# Patient Record
Sex: Female | Born: 1980 | Hispanic: Yes | Marital: Single | Smoking: Current every day smoker
Health system: Southern US, Community
[De-identification: ages and names within clinical notes are randomized; demographics above are authoritative.]

## PROBLEM LIST (undated history)

## (undated) HISTORY — PX: CHOLECYSTECTOMY: SHX55

## (undated) HISTORY — PX: TUBAL LIGATION: SHX77

---
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
## 2002-04-23 ENCOUNTER — Emergency Department (HOSPITAL_COMMUNITY): Admission: EM | Admit: 2002-04-23 | Discharge: 2002-04-24 | Payer: Self-pay | Admitting: Emergency Medicine

## 2009-10-30 ENCOUNTER — Emergency Department (HOSPITAL_COMMUNITY)
Admission: EM | Admit: 2009-10-30 | Discharge: 2009-10-30 | Payer: Self-pay | Source: Home / Self Care | Admitting: Emergency Medicine

## 2009-12-24 ENCOUNTER — Ambulatory Visit (HOSPITAL_COMMUNITY): Admission: RE | Admit: 2009-12-24 | Discharge: 2009-12-24 | Payer: Self-pay | Admitting: Family Medicine

## 2010-01-14 ENCOUNTER — Ambulatory Visit (HOSPITAL_COMMUNITY): Admission: RE | Admit: 2010-01-14 | Discharge: 2010-01-14 | Payer: Self-pay | Admitting: Family Medicine

## 2010-03-06 ENCOUNTER — Inpatient Hospital Stay (HOSPITAL_COMMUNITY)
Admission: AD | Admit: 2010-03-06 | Discharge: 2010-03-06 | Payer: Self-pay | Source: Home / Self Care | Admitting: Obstetrics & Gynecology

## 2010-03-07 ENCOUNTER — Ambulatory Visit: Payer: Self-pay | Admitting: Obstetrics and Gynecology

## 2010-03-07 ENCOUNTER — Ambulatory Visit: Payer: Self-pay | Admitting: Obstetrics & Gynecology

## 2010-03-07 ENCOUNTER — Inpatient Hospital Stay (HOSPITAL_COMMUNITY): Admission: AD | Admit: 2010-03-07 | Discharge: 2010-03-09 | Payer: Self-pay | Admitting: Obstetrics & Gynecology

## 2010-05-07 ENCOUNTER — Encounter: Payer: Self-pay | Admitting: Family Medicine

## 2010-06-28 LAB — CBC
HCT: 37.1 % (ref 36.0–46.0)
Hemoglobin: 12.2 g/dL (ref 12.0–15.0)
MCH: 26.3 pg (ref 26.0–34.0)
MCHC: 33 g/dL (ref 30.0–36.0)
MCV: 79.5 fL (ref 78.0–100.0)
Platelets: 164 10*3/uL (ref 150–400)
RBC: 4.66 MIL/uL (ref 3.87–5.11)
RDW: 13.3 % (ref 11.5–15.5)
WBC: 8.8 10*3/uL (ref 4.0–10.5)

## 2010-06-28 LAB — RPR: RPR Ser Ql: NONREACTIVE

## 2010-07-02 LAB — URINALYSIS, ROUTINE W REFLEX MICROSCOPIC
Protein, ur: NEGATIVE mg/dL
Urobilinogen, UA: 0.2 mg/dL (ref 0.0–1.0)

## 2010-07-02 LAB — DIFFERENTIAL
Basophils Absolute: 0 10*3/uL (ref 0.0–0.1)
Basophils Relative: 0 % (ref 0–1)
Monocytes Absolute: 0.7 10*3/uL (ref 0.1–1.0)
Neutro Abs: 5.7 10*3/uL (ref 1.7–7.7)
Neutrophils Relative %: 71 % (ref 43–77)

## 2010-07-02 LAB — URINE MICROSCOPIC-ADD ON

## 2010-07-02 LAB — URINE CULTURE
Colony Count: NO GROWTH
Culture: NO GROWTH

## 2010-07-02 LAB — CBC
HCT: 35.4 % — ABNORMAL LOW (ref 36.0–46.0)
MCHC: 34.5 g/dL (ref 30.0–36.0)
RDW: 12.7 % (ref 11.5–15.5)

## 2010-07-21 ENCOUNTER — Inpatient Hospital Stay (INDEPENDENT_AMBULATORY_CARE_PROVIDER_SITE_OTHER)
Admission: RE | Admit: 2010-07-21 | Discharge: 2010-07-21 | Disposition: A | Discharge status: Home / Self Care | Payer: Medicaid Other | Source: Ambulatory Visit | Attending: Family Medicine | Admitting: Family Medicine

## 2010-07-21 DIAGNOSIS — J069 Acute upper respiratory infection, unspecified: Secondary | ICD-10-CM

## 2010-12-31 ENCOUNTER — Emergency Department (HOSPITAL_COMMUNITY): Payer: Medicaid Other

## 2010-12-31 ENCOUNTER — Emergency Department (HOSPITAL_COMMUNITY)
Admission: EM | Admit: 2010-12-31 | Discharge: 2010-12-31 | Disposition: A | Discharge status: Home / Self Care | Payer: Medicaid Other | Attending: Internal Medicine | Admitting: Internal Medicine

## 2010-12-31 DIAGNOSIS — Z9089 Acquired absence of other organs: Secondary | ICD-10-CM | POA: Insufficient documentation

## 2010-12-31 DIAGNOSIS — R109 Unspecified abdominal pain: Secondary | ICD-10-CM | POA: Insufficient documentation

## 2010-12-31 DIAGNOSIS — R079 Chest pain, unspecified: Secondary | ICD-10-CM | POA: Insufficient documentation

## 2010-12-31 LAB — URINALYSIS, ROUTINE W REFLEX MICROSCOPIC
Bilirubin Urine: NEGATIVE
Hgb urine dipstick: NEGATIVE
Ketones, ur: NEGATIVE mg/dL
Nitrite: NEGATIVE
Urobilinogen, UA: 0.2 mg/dL (ref 0.0–1.0)

## 2010-12-31 LAB — DIFFERENTIAL
Eosinophils Absolute: 0.1 10*3/uL (ref 0.0–0.7)
Eosinophils Relative: 2 % (ref 0–5)
Lymphs Abs: 2.4 10*3/uL (ref 0.7–4.0)
Monocytes Absolute: 0.6 10*3/uL (ref 0.1–1.0)
Monocytes Relative: 8 % (ref 3–12)

## 2010-12-31 LAB — COMPREHENSIVE METABOLIC PANEL
Alkaline Phosphatase: 77 U/L (ref 39–117)
BUN: 10 mg/dL (ref 6–23)
CO2: 27 mEq/L (ref 19–32)
Calcium: 9.4 mg/dL (ref 8.4–10.5)
GFR calc Af Amer: >60 mL/min (ref >60)
GFR calc non Af Amer: >60 mL/min (ref >60)
Glucose, Bld: 85 mg/dL (ref 70–99)
Potassium: 4.3 mEq/L (ref 3.5–5.1)
Total Protein: 7.3 g/dL (ref 6.0–8.3)

## 2010-12-31 LAB — CBC
MCH: 27.7 pg (ref 26.0–34.0)
MCHC: 35.3 g/dL (ref 30.0–36.0)
MCV: 78.4 fL (ref 78.0–100.0)
Platelets: 219 10*3/uL (ref 150–400)
RDW: 12.5 % (ref 11.5–15.5)

## 2010-12-31 LAB — POCT PREGNANCY, URINE: Preg Test, Ur: NEGATIVE

## 2010-12-31 LAB — LIPASE, BLOOD: Lipase: 38 U/L (ref 11–59)

## 2012-07-07 ENCOUNTER — Encounter (HOSPITAL_COMMUNITY): Payer: Self-pay | Admitting: Family Medicine

## 2012-07-07 ENCOUNTER — Emergency Department (HOSPITAL_COMMUNITY)
Admission: EM | Admit: 2012-07-07 | Discharge: 2012-07-07 | Disposition: A | Discharge status: Home / Self Care | Payer: Self-pay | Attending: Emergency Medicine | Admitting: Emergency Medicine

## 2012-07-07 DIAGNOSIS — H18839 Recurrent erosion of cornea, unspecified eye: Secondary | ICD-10-CM | POA: Insufficient documentation

## 2012-07-07 DIAGNOSIS — F17200 Nicotine dependence, unspecified, uncomplicated: Secondary | ICD-10-CM | POA: Insufficient documentation

## 2012-07-07 DIAGNOSIS — H16002 Unspecified corneal ulcer, left eye: Secondary | ICD-10-CM

## 2012-07-07 DIAGNOSIS — R209 Unspecified disturbances of skin sensation: Secondary | ICD-10-CM | POA: Insufficient documentation

## 2012-07-07 DIAGNOSIS — H5460 Unqualified visual loss, one eye, unspecified: Secondary | ICD-10-CM | POA: Insufficient documentation

## 2012-07-07 MED ORDER — TEARS RENEWED OP SOLN: 1.0000 [drp] | Freq: Three times a day (TID) | OPHTHALMIC | Status: DC | PRN

## 2012-07-07 MED ORDER — BACITRACIN-POLYMYXIN B 500-10000 UNIT/GM OP OINT: TOPICAL_OINTMENT | Freq: Two times a day (BID) | OPHTHALMIC | Status: DC

## 2012-07-07 MED ORDER — TETRACAINE HCL 0.5 % OP SOLN
2.0000 [drp] | Freq: Once | OPHTHALMIC | Status: DC
  Filled 2012-07-07: qty 2

## 2012-07-07 NOTE — ED Notes (Signed)
Patient states that she was watching TV four hours ago and her left eye started hurting. States her left eye started running 3 hours ago. Denies injury or trauma. States there is a "scratching" sensation and feels like something is in her eye. Reports that her vision is blurry.

## 2012-07-07 NOTE — ED Provider Notes (Signed)
History    This chart was scribed for non-physician practitioner working with Richardean Canal, MD by Donne Anon, ED Scribe. This patient was seen in room WTR7/WTR7 and the patient's care was started at 2024.   CSN: [id number]  Arrival date & time 07/07/12  1929   First MD Initiated Contact with Patient 07/07/12 2024      Chief Complaint  Patient presents with  . Eye Pain     The history is provided by the patient. No language interpreter was used.   Sierra Carson is a 32 y.o. female who presents to the Emergency Department complaining of sudden onset, constant, gradually worsening moderate left eye pain which began 4 hours PTA while she was watching TV. She reports associated watering of her left eye. She denies any injury or trauma. She states there is a scratching sensation and that her vision is slightly decreased in her left eye. She does not have pets in her home.   History reviewed. No pertinent past medical history.  Past Surgical History  Procedure Laterality Date  . Cholecystectomy      No family history on file.  History  Substance Use Topics  . Smoking status: Current Every Day Smoker -- 0.25 packs/day    Types: Cigarettes  . Smokeless tobacco: Not on file  . Alcohol Use: No    OB History   Grav Para Term Preterm Abortions TAB SAB Ect Mult Living                  Review of Systems  HENT:       Positive for left eye pain.  All other systems reviewed and are negative.    Allergies  Review of patient's allergies indicates no known allergies.  Home Medications   Current Outpatient Rx  Name  Route  Sig  Dispense  Refill  . Hyprom-Naphaz-Polysorb-Zn Sulf (CLEAR EYES COMPLETE OP)   Left Eye   Place 1 drop into the left eye daily as needed (for eye).         . bacitracin-polymyxin b (POLYSPORIN) ophthalmic ointment   Left Eye   Place into the left eye every 12 (twelve) hours. apply to eye every 12 hours while awake   3.5 g   0   . dextran  70-hypromellose (TEARS RENEWED) ophthalmic solution   Left Eye   Place 1 drop into the left eye 3 (three) times daily as needed.   15 mL   12     BP 133/80  Pulse 72  Temp(Src) 97.8 F (36.6 C) (Oral)  SpO2 100%  LMP 07/03/2012  Physical Exam  Nursing note and vitals reviewed. Constitutional: She is oriented to person, place, and time. She appears well-developed and well-nourished. No distress.  HENT:  Head: Normocephalic and atraumatic.  Eye is teary. Pupils are equal and round and reactive to light.  Eyes: EOM are normal. Pupils are equal, round, and reactive to light.  fluorescein stain shows no up take OIP- LE = 18, 20, 18 RE= 20, 21, 19  EOMIS intact Cornea hows no abnormality.  PERRLA  Neck: Neck supple. No tracheal deviation present.  Cardiovascular: Normal rate.   Pulmonary/Chest: Effort normal. No respiratory distress.  Musculoskeletal: Normal range of motion.  Neurological: She is alert and oriented to person, place, and time.  Skin: Skin is warm and dry.  Psychiatric: She has a normal mood and affect. Her behavior is normal.    ED Course  Procedures (including critical care time)  DIAGNOSTIC STUDIES: Oxygen Saturation is 100% on room air, normal by my interpretation.    COORDINATION OF CARE: 8:34 PM Discussed treatment plan which includes eye exam with pt at bedside and pt agreed to plan.   9:13 pm- discussed case with Dr. Randon Goldsmith- Ophtho. Her exam is reassuring. He questions if she has a possible corneal erosion. Will Rx Bacitracin ointment and artificial tear drops. If eye still hurts in the morning she can call clinic for an appointment.  Labs Reviewed - No data to display No results found.   1. Corneal erosion, left       MDM  Pt has been advised of the symptoms that warrant their return to the ED. Patient has voiced understanding and has agreed to follow-up with the PCP or specialist.  I personally performed the services described in this  documentation, which was scribed in my presence. The recorded information has been reviewed and is accurate.     Dorthula Matas, PA-C 07/07/12 2356

## 2012-07-10 NOTE — ED Provider Notes (Signed)
Medical screening examination/treatment/procedure(s) were performed by non-physician practitioner and as supervising physician I was immediately available for consultation/collaboration.   Richardean Canal, MD 07/10/12 [phone]

## 2013-07-16 ENCOUNTER — Emergency Department (HOSPITAL_COMMUNITY)
Admission: EM | Admit: 2013-07-16 | Discharge: 2013-07-16 | Disposition: A | Discharge status: Home / Self Care | Payer: Medicaid Other | Attending: Emergency Medicine | Admitting: Emergency Medicine

## 2013-07-16 ENCOUNTER — Encounter (HOSPITAL_COMMUNITY): Payer: Self-pay | Admitting: Emergency Medicine

## 2013-07-16 DIAGNOSIS — F17200 Nicotine dependence, unspecified, uncomplicated: Secondary | ICD-10-CM | POA: Insufficient documentation

## 2013-07-16 DIAGNOSIS — S161XXA Strain of muscle, fascia and tendon at neck level, initial encounter: Secondary | ICD-10-CM

## 2013-07-16 DIAGNOSIS — Y9389 Activity, other specified: Secondary | ICD-10-CM | POA: Insufficient documentation

## 2013-07-16 DIAGNOSIS — S335XXA Sprain of ligaments of lumbar spine, initial encounter: Secondary | ICD-10-CM | POA: Insufficient documentation

## 2013-07-16 DIAGNOSIS — S39012A Strain of muscle, fascia and tendon of lower back, initial encounter: Secondary | ICD-10-CM

## 2013-07-16 DIAGNOSIS — Y92410 Unspecified street and highway as the place of occurrence of the external cause: Secondary | ICD-10-CM | POA: Insufficient documentation

## 2013-07-16 DIAGNOSIS — S139XXA Sprain of joints and ligaments of unspecified parts of neck, initial encounter: Secondary | ICD-10-CM | POA: Insufficient documentation

## 2013-07-16 MED ORDER — IBUPROFEN 800 MG PO TABS: 800.0000 mg | ORAL_TABLET | Freq: Three times a day (TID) | ORAL | Status: DC

## 2013-07-16 MED ORDER — CYCLOBENZAPRINE HCL 10 MG PO TABS: 10.0000 mg | ORAL_TABLET | Freq: Two times a day (BID) | ORAL | Status: AC | PRN

## 2013-07-16 MED ORDER — HYDROCODONE-ACETAMINOPHEN 5-325 MG PO TABS: 1.0000 | ORAL_TABLET | Freq: Four times a day (QID) | ORAL | Status: DC | PRN
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
## 2013-07-16 NOTE — ED Provider Notes (Signed)
Medical screening examination/treatment/procedure(s) were performed by non-physician practitioner and as supervising physician I was immediately available for consultation/collaboration.   EKG Interpretation None        Gilda Creasehristopher J. Pollina, MD 07/16/13 [phone]

## 2014-06-18 ENCOUNTER — Encounter (HOSPITAL_COMMUNITY): Payer: Self-pay | Admitting: *Deleted

## 2014-06-18 ENCOUNTER — Emergency Department (HOSPITAL_COMMUNITY): Payer: Medicaid Other

## 2014-06-18 ENCOUNTER — Emergency Department (HOSPITAL_COMMUNITY)
Admission: EM | Admit: 2014-06-18 | Discharge: 2014-06-18 | Disposition: A | Discharge status: Home / Self Care | Payer: Medicaid Other | Attending: Emergency Medicine | Admitting: Emergency Medicine

## 2014-06-18 DIAGNOSIS — R062 Wheezing: Secondary | ICD-10-CM | POA: Insufficient documentation

## 2014-06-18 DIAGNOSIS — R0789 Other chest pain: Secondary | ICD-10-CM

## 2014-06-18 DIAGNOSIS — Z720 Tobacco use: Secondary | ICD-10-CM | POA: Insufficient documentation

## 2014-06-18 DIAGNOSIS — H109 Unspecified conjunctivitis: Secondary | ICD-10-CM

## 2014-06-18 DIAGNOSIS — R0602 Shortness of breath: Secondary | ICD-10-CM | POA: Insufficient documentation

## 2014-06-18 LAB — I-STAT TROPONIN, ED: Troponin i, poc: 0 ng/mL (ref 0.00–0.08)

## 2014-06-18 LAB — CBC
HCT: 44.7 % (ref 36.0–46.0)
Hemoglobin: 16.1 g/dL — ABNORMAL HIGH (ref 12.0–15.0)
MCH: 28.8 pg (ref 26.0–34.0)
MCHC: 36 g/dL (ref 30.0–36.0)
MCV: 79.8 fL (ref 78.0–100.0)
PLATELETS: 281 10*3/uL (ref 150–400)
RBC: 5.6 MIL/uL — ABNORMAL HIGH (ref 3.87–5.11)
RDW: 12.4 % (ref 11.5–15.5)
WBC: 8.8 10*3/uL (ref 4.0–10.5)

## 2014-06-18 LAB — BASIC METABOLIC PANEL
ANION GAP: 10 (ref 5–15)
BUN: 6 mg/dL (ref 6–23)
CO2: 22 mmol/L (ref 19–32)
CREATININE: 0.98 mg/dL (ref 0.50–1.10)
Calcium: 9.4 mg/dL (ref 8.4–10.5)
Chloride: 107 mmol/L (ref 96–112)
GFR, EST AFRICAN AMERICAN: 88 mL/min — AB (ref >90)
GFR, EST NON AFRICAN AMERICAN: 76 mL/min — AB (ref >90)
Glucose, Bld: 82 mg/dL (ref 70–99)
Potassium: 3.1 mmol/L — ABNORMAL LOW (ref 3.5–5.1)
Sodium: 139 mmol/L (ref 135–145)

## 2014-06-18 MED ORDER — ALBUTEROL SULFATE HFA 108 (90 BASE) MCG/ACT IN AERS
2.0000 | INHALATION_SPRAY | RESPIRATORY_TRACT | Status: DC
  Administered 2014-06-18: 2 via RESPIRATORY_TRACT
  Filled 2014-06-18: qty 6.7

## 2014-06-18 MED ORDER — ALBUTEROL SULFATE (2.5 MG/3ML) 0.083% IN NEBU
2.5000 mg | INHALATION_SOLUTION | Freq: Once | RESPIRATORY_TRACT | Status: AC
  Administered 2014-06-18: 2.5 mg via RESPIRATORY_TRACT
  Filled 2014-06-18: qty 3

## 2014-06-18 MED ORDER — ERYTHROMYCIN 5 MG/GM OP OINT
TOPICAL_OINTMENT | Freq: Once | OPHTHALMIC | Status: AC
  Administered 2014-06-18: 1 via OPHTHALMIC
  Filled 2014-06-18: qty 3.5

## 2014-06-18 NOTE — ED Provider Notes (Signed)
CSN: [id number]     Arrival date & time 06/18/14  1845 History   First MD Initiated Contact with Patient 06/18/14 1848     Chief Complaint  Patient presents with  . Chest Pain  . Eye Problem     (Consider location/radiation/quality/duration/timing/severity/associated sxs/prior Treatment) Patient is a 34 y.o. female presenting with chest pain and eye problem. The history is provided by the patient. No language interpreter was used.  Chest Pain Associated symptoms: shortness of breath   Associated symptoms: no abdominal pain, no cough, no fever, no headache, no nausea and not vomiting   Eye Problem Associated symptoms: discharge and redness   Associated symptoms: no headaches, no nausea and no vomiting   [name] presents for gradual onset chest pain that feels like pressure/heaviness and began at 6:30 last night and has progressively worsened with difficulty breathing and shortness of breath. There is no radiation of her chest pain to the neck, jaw, or left arm. She also has bilateral red conjunctiva that began 2 days ago. She denies any ear pain, sore throat, headache, nausea, vomiting, or abdominal pain. She is a 1/4 ppd smoker.   History reviewed. No pertinent past medical history. Past Surgical History  Procedure Laterality Date  . Cholecystectomy     No family history on file. History  Substance Use Topics  . Smoking status: Current Every Day Smoker -- 0.25 packs/day    Types: Cigarettes  . Smokeless tobacco: Not on file  . Alcohol Use: No   OB History    No data available     Review of Systems  Constitutional: Negative for fever and chills.  HENT: Negative for ear pain and sore throat.   Eyes: Positive for discharge and redness. Negative for visual disturbance.  Respiratory: Positive for shortness of breath. Negative for cough.   Cardiovascular: Positive for chest pain.  Gastrointestinal: Negative for nausea, vomiting and abdominal pain.  Neurological: Negative for  headaches.  All other systems reviewed and are negative.     Allergies  Review of patient's allergies indicates no known allergies.  Home Medications   Prior to Admission medications   Medication Sig Start Date End Date Taking? Authorizing Provider  cyclobenzaprine (FLEXERIL) 10 MG tablet Take 1 tablet (10 mg total) by mouth 2 (two) times daily as needed for muscle spasms. Patient not taking: Reported on 06/18/2014 07/16/13   Roxy Horsemanobert Browning, PA-C  HYDROcodone-acetaminophen (NORCO/VICODIN) 5-325 MG per tablet Take 1-2 tablets by mouth every 6 (six) hours as needed. Patient not taking: Reported on 06/18/2014 07/16/13   Roxy Horsemanobert Browning, PA-C  ibuprofen (ADVIL,MOTRIN) 800 MG tablet Take 1 tablet (800 mg total) by mouth 3 (three) times daily. Patient not taking: Reported on 06/18/2014 07/16/13   Roxy Horsemanobert Browning, PA-C   BP 123/77 mmHg  Pulse 59  Temp(Src) 98 F (36.7 C) (Oral)  Resp 17  SpO2 100%  LMP 05/20/2014 Physical Exam  Constitutional: She is oriented to person, place, and time. She appears well-developed and well-nourished.  HENT:  Head: Normocephalic and atraumatic.  Eyes: EOM are normal. Pupils are equal, round, and reactive to light.  Bilateral conjunctiva are red and there is some dry crusting around the eyelashes.    Neck: Normal range of motion. Neck supple.  Cardiovascular: Normal rate, regular rhythm and normal heart sounds.   Pulmonary/Chest: Effort normal. No respiratory distress. She has wheezes in the left upper field, the left middle field and the left lower field.  Abdominal: Soft. There is no tenderness.  Musculoskeletal: Normal range of motion.  Neurological: She is alert and oriented to person, place, and time.  Skin: Skin is warm and dry.  Nursing note and vitals reviewed.   ED Course  Procedures (including critical care time) Labs Review Labs Reviewed  CBC - Abnormal; Notable for the following:    RBC 5.60 (*)    Hemoglobin 16.1 (*)    All other components  within normal limits  BASIC METABOLIC PANEL - Abnormal; Notable for the following:    Potassium 3.1 (*)    GFR calc non Af Amer 76 (*)    GFR calc Af Amer 88 (*)    All other components within normal limits  I-STAT TROPOININ, ED    Imaging Review Dg Chest 2 View  06/18/2014   CLINICAL DATA:  Midline non radiating chest pain, shortness of breath  EXAM: CHEST  2 VIEW  COMPARISON:  12/31/2010  FINDINGS: The heart size and mediastinal contours are within normal limits. Both lungs are clear. The visualized skeletal structures are unremarkable.  IMPRESSION: No active cardiopulmonary disease.   Electronically Signed   By: Elige Ko   On: 06/18/2014 19:33     EKG Interpretation   Date/Time:  Thursday June 18 2014 18:51:47 EST Ventricular Rate:  66 PR Interval:  151 QRS Duration: 92 QT Interval:  422 QTC Calculation: 442 R Axis:   68 Text Interpretation:  Sinus rhythm Sinus rhythm Artifact Normal ECG  Confirmed by Gerhard Munch  MD ([id number]) on 06/18/2014 6:55:54 PM      MDM   Final diagnoses:  Bilateral conjunctivitis  Atypical chest pain  A chest xray was ordered due to chest pain which shows clear luns and no cardiopulmonary disease.  She has left sided wheezing on exam so a duoneb was ordered.    EKG showed no acute abnormality. Her troponin was negative. CBC was normal. BNP showed hypokalemia which Dr. Jeraldine Loots and I decided not to treat but I did explain the results to the patient and gave her the recourse guide to f/u for both hyperkalemia and conjunctivitis.  Patient states that there is discharge from both of her eyes and that she has to pry her eyes open in the morning after placing a wet towel over them in order to remove the crusting around her eyes. She does not wear contact lenses and has no foreign body sensation. PERRL No vision changes. 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  Discussed frequent hand washing.    She was discharged with an albuterol inhaler and erythromycin drops.    Catha Gosselin, PA-C 06/19/14 1259  Gerhard Munch, MD 06/20/14 [phone]

## 2014-06-18 NOTE — Discharge Instructions (Signed)
Chest Pain (Nonspecific) °It is often hard to give a specific diagnosis for the cause of chest pain. There is always a chance that your pain could be related to something serious, such as a heart attack or a blood clot in the lungs. You need to follow up with your health care provider for further evaluation. °CAUSES  °· Heartburn. °· Pneumonia or bronchitis. °· Anxiety or stress. °· Inflammation around your heart (pericarditis) or lung (pleuritis or pleurisy). °· A blood clot in the lung. °· A collapsed lung (pneumothorax). It can develop suddenly on its own (spontaneous pneumothorax) or from trauma to the chest. °· Shingles infection (herpes zoster virus). °The chest wall is composed of bones, muscles, and cartilage. Any of these can be the source of the pain. °· The bones can be bruised by injury. °· The muscles or cartilage can be strained by coughing or overwork. °· The cartilage can be affected by inflammation and become sore (costochondritis). °DIAGNOSIS  °Lab tests or other studies may be needed to find the cause of your pain. Your health care provider may have you take a test called an ambulatory electrocardiogram (ECG). An ECG records your heartbeat patterns over a 24-hour period. You may also have other tests, such as: °· Transthoracic echocardiogram (TTE). During echocardiography, sound waves are used to evaluate how blood flows through your heart. °· Transesophageal echocardiogram (TEE). °· Cardiac monitoring. This allows your health care provider to monitor your heart rate and rhythm in real time. °· Holter monitor. This is a portable device that records your heartbeat and can help diagnose heart arrhythmias. It allows your health care provider to track your heart activity for several days, if needed. °· Stress tests by exercise or by giving medicine that makes the heart beat faster. °TREATMENT  °· Treatment depends on what may be causing your chest pain. Treatment may include: °¨ Acid blockers for  heartburn. °¨ Anti-inflammatory medicine. °¨ Pain medicine for inflammatory conditions. °¨ Antibiotics if an infection is present. °· You may be advised to change lifestyle habits. This includes stopping smoking and avoiding alcohol, caffeine, and chocolate. °· You may be advised to keep your head raised (elevated) when sleeping. This reduces the chance of acid going backward from your stomach into your esophagus. °Most of the time, nonspecific chest pain will improve within 2-3 days with rest and mild pain medicine.  °HOME CARE INSTRUCTIONS  °· If antibiotics were prescribed, take them as directed. Finish them even if you start to feel better. °· For the next few days, avoid physical activities that bring on chest pain. Continue physical activities as directed. °· Do not use any tobacco products, including cigarettes, chewing tobacco, or electronic cigarettes. °· Avoid drinking alcohol. °· Only take medicine as directed by your health care provider. °· Follow your health care provider's suggestions for further testing if your chest pain does not go away. °· Keep any follow-up appointments you made. If you do not go to an appointment, you could develop lasting (chronic) problems with pain. If there is any problem keeping an appointment, call to reschedule. °SEEK MEDICAL CARE IF:  °· Your chest pain does not go away, even after treatment. °· You have a rash with blisters on your chest. °· You have a fever. °SEEK IMMEDIATE MEDICAL CARE IF:  °· You have increased chest pain or pain that spreads to your arm, neck, jaw, back, or abdomen. °· You have shortness of breath. °· You have an increasing cough, or you cough   up blood.  You have severe back or abdominal pain.  You feel nauseous or vomit.  You have severe weakness.  You faint.  You have chills. This is an emergency. Do not wait to see if the pain will go away. Get medical help at once. Call your local emergency services (911 in [place] Do not drive  yourself to the hospital. MAKE SURE YOU:   Understand these instructions.  Will watch your condition.  Will get help right away if you are not doing well or get worse. Document Released: 01/11/2005 Document Revised: 04/08/2013 Document Reviewed: 11/07/2007 [facility] Patient Information 2015 [place], [place]. This information is not intended to replace advice given to you by your health care provider. Make sure you discuss any questions you have with your health care provider.  Conjunctivitis Conjunctivitis is commonly called "pink eye." Conjunctivitis can be caused by bacterial or viral infection, allergies, or injuries. There is usually redness of the lining of the eye, itching, discomfort, and sometimes discharge. There may be deposits of matter along the eyelids. A viral infection usually causes a watery discharge, while a bacterial infection causes a yellowish, thick discharge. Pink eye is very contagious and spreads by direct contact. You may be given antibiotic eyedrops as part of your treatment. Before using your eye medicine, remove all drainage from the eye by washing gently with warm water and cotton balls. Continue to use the medication until you have awakened 2 mornings in a row without discharge from the eye. Do not rub your eye. This increases the irritation and helps spread infection. Use separate towels from other household members. Wash your hands with soap and water before and after touching your eyes. Use cold compresses to reduce pain and sunglasses to relieve irritation from light. Do not wear contact lenses or wear eye makeup until the infection is gone. SEEK MEDICAL CARE IF:   Your symptoms are not better after 3 days of treatment.  You have increased pain or trouble seeing.  The outer eyelids become very red or swollen. Document Released: 05/11/2004 Document Revised: 06/26/2011 Document Reviewed: 04/03/2005 [facility] Patient Information 2015 [place], [place]. This  information is not intended to replace advice given to you by your health care provider. Make sure you discuss any questions you have with your health care provider.   Emergency Department Resource Guide 1) Find a Doctor and Pay Out of Pocket Although you won't have to find out who is covered by your insurance plan, it is a good idea to ask around and get recommendations. You will then need to call the office and see if the doctor you have chosen will accept you as a new patient and what types of options they offer for patients who are self-pay. Some doctors offer discounts or will set up payment plans for their patients who do not have insurance, but you will need to ask so you aren't surprised when you get to your appointment.  2) Contact Your Local Health Department Not all health departments have doctors that can see patients for sick visits, but many do, so it is worth a call to see if yours does. If you don't know where your local health department is, you can check in your phone book. The CDC also has a tool to help you locate your state's health department, and many state websites also have listings of all of their local health departments.  3) Find a Walk-in Clinic If your illness is not likely to be very severe or  complicated, you may want to try a walk in clinic. These are popping up all over the country in pharmacies, drugstores, and shopping centers. They're usually staffed by nurse practitioners or physician assistants that have been trained to treat common illnesses and complaints. They're usually fairly quick and inexpensive. However, if you have serious medical issues or chronic medical problems, these are probably not your best option.  No Primary Care Doctor: - Call Health Connect at  [phone] - they can help you locate a primary care doctor that  accepts your insurance, provides certain services, etc. - Physician Referral Service- [phone]  Chronic Pain  Problems: Organization         Address  Phone   Notes  [facility]  [phone] Patients need to be referred by their primary care doctor.   Medication Assistance: Organization         Address  Phone   Notes  [facility] Medication [facility] [address] [place] [place], [place] [address] [phone] --Must be a resident of [facility] -- Must have NO insurance coverage whatsoever (no Medicaid/ Medicare, etc.) -- The pt. MUST have a primary care doctor that directs their care regularly and follows them in the community   [facility]ist  [phone]   [employer]  [phone]    Agencies that provide inexpensive medical care: Organization         Address  Phone   Notes  [facility]  [phone]   [facility]    [phone]   [facility] [address] [place], [place] [address] [phone]   [facility] [address] [place] [place] [phone]   Planned Parenthood    [phone]   [facility]    [phone]   Community Health and [facility]  [address], [place] Phone:  [phone], Fax:  [phone] Hours of Operation:  9 am - 6 pm, M-F.  Also accepts Medicaid/Medicare and self-pay.  [facility]  [address], [place] Phone: [phone], Fax: [phone]. Hours of Operation:  8:30 am - 5:30 pm, M-F.  Also accepts Medicaid and self-pay.  [facility] [place] [address] [place] Phone: [phone]   [facility] [address] Natasha Bence [place], [place] [phone], Ext. [phone] Mondays & Thursdays: 7-9 AM.  First 15 patients are seen on a first come, first serve basis.    Medicaid-accepting [facility] Providers:  Organization         Address  Phone   Notes  [facility] [address], [place] [phone] Also  accepts self-pay patients.  [facility] [address] Laurell Josephs [place], [place]  [phone]   [facility] [address], [place] [phone]   [facility] [address] [place] [phone]   Renaye Rakers [address], [place]   [phone] Only accepts [place] Access [place] patients after they have their name applied to their card.   Self-Pay (no insurance) in [facility]:  Organization         Address  Phone   Notes  Sickle Cell Patients, [facility] [address] [place], [place] [phone]   [facility] [address], [place] [phone]   [facility]  [address], [place] [phone]   [facility]/Dr. Osei-Bonsu  [address] [place] [address], [place] or [phone], [place] [phone] Phone number for both [place] and [place] locations is the same.  [facility] [address], [place] [phone]   [facility] [address] [place] [address], [place] or [address] [phone]) [phone]   [facility] [address], [place] [phone]) [phone], phone; [phone](336) [phone], fax Sees patients 1st and 3rd Saturday of every month.  Must not qualify for public or private insurance (i.e. Medicaid, Medicare, [place] Health Choice, Veterans' Benefits)  Household income should be no more than 200% of the poverty level The clinic cannot treat you if you are pregnant or think you are pregnant  Sexually transmitted diseases are not treated at the clinic.    Dental Care: Organization         Address  Phone  Notes  [facility] [address], [place] [phone] Accepts children up to age 33 who are enrolled in [place] or [place] Health Choice; pregnant  women with a Medicaid card; and children who have applied for Medicaid or [place] Health Choice, but were declined, whose parents can pay a reduced fee at time of service.  [facility] [place]  [address], [place] [phone] Accepts children up to age 33 who are enrolled in [place] or [place] Health Choice; pregnant women with a Medicaid card; and children who have applied for Medicaid or [place] Health Choice, but were declined, whose parents can pay a reduced fee at time of service.  [facility]  [address], [place] [phone] Patients are seen by appointment only. Walk-ins are not accepted. [facility] will see patients 33 years of age and older. Monday - Tuesday (8am-5pm) Most Wednesdays (8:30-5pm) $30 per visit, cash only  [facility]  [address], [facility] [phone] Patients are seen by appointment only. Walk-ins are not accepted. [facility] will see patients 33 years of age and older. One Wednesday Evening (Monthly: Volunteer Based).  $30 per visit, cash only  [employer] [facility]  [phone]) [phone] for adults; Children under age 33, call Graduate Pediatric Dentistry at [phone]. Children aged 33-14, please call [phone]) [phone] to request a pediatric application.  Dental services are provided in all areas of dental care including fillings, crowns and bridges, complete and partial dentures, implants, gum treatment, root canals, and extractions. Preventive care is also provided. Treatment is provided to both adults and children. Patients are selected via a lottery and there is often a waiting list.   [facility] [address], [place]  [phone] [email or web address]   [facility] [address], [place], [place] [phone], Ext. [phone] Second and Fourth Thursday of each month, opens at 6:30 AM; Clinic ends at 9 AM.  Patients are  seen on a first-come first-served basis, and a limited number are seen during each clinic.   [facility]  [address], [place], [place] [phone]   Eligibility Requirements You must have lived in [place], [place], or [place] counties for at least the last three months.   You cannot be  eligible for state or federal sponsored [place], including [employer], [place], or [employer].   You generally cannot be eligible for healthcare insurance through your employer.    How to apply: Eligibility screenings are held every Tuesday and Wednesday afternoon from 1:00 pm until 4:00 pm. You do not need an appointment for the interview!  [facility] [address], [place], [place] [phone]   [facility] Health Department  [phone]   [facility] Health Department  [phone]   [facility] Health Department  [phone]    Behavioral Health Resources in the Community: Intensive Outpatient Programs Organization         Address  Phone  Notes  [facility] Services [address] [place], [place] [phone]   [facility] Outpatient [address] [place], [place] [phone]   [facility] [address] [place], [place]  [phone]   [facility] Mental Health [address]  [place], [place] [phone] or [phone]   Substance Abuse Resources Organization         Address  Phone  Notes  Al[facility] Drug Services  [phone]   [facility]  [phone]   [facility]  [phone]   Floydene Flock  [phone]   Residential & Outpatient Substance Abuse Program  [phone]   Psychological Services Organization         Address  Phone  Notes  [facility] Behavioral Health  [phone]   [facility]  [phone]   [facility] Mental Health [address] [place] [phone] or [phone]    Mobile Crisis  Teams Organization         Address  Phone  Notes  [facility], Mobile Crisis Care Unit  [phone]   [facility]  [address] [place], [place] [phone]   Doristine Locks [address] [place] [phone]    Self-Help/Support Groups Organization         Address  Phone             Notes  [facility] [place] - variety of support groups  336- I7437963 Call for more information  Narcotics Anonymous (NA), [facility] [address], [employer] [place]  2 meetings at this location   Statistician         Address  Phone  Notes  [facility] [address],    [place]  [phone]   [facility]  [address] [place] [address], [place], [place] [phone]   [facility] [address] [place], [place] [phone] Admissions: 8am-3pm M-F  [facility] [address],    [place], [place] [phone]   [facility] [address] [place], [place], [place] [phone]   [facility] [address],  [place], [place] [phone]   [facility] - Intensive Outpatient [address]ure[address] Joseph[address], [place], [place] [phone]   [facility] ([facility] [address] [place].,  [place], [place] [phone] or [phone]   [facility] ([facility]) [address] [place], [place] [phone] Accepts Medicaid  [facility] [address]  [place] [phone] Substance Abuse/Addiction Treatment   [facility] Organization         Address  Phone  Notes  [facility]  [phone]   Angie Fava, PhD [address],  [place]   [phone] or [phone]) [phone]   [facility]   [address] [place], [place] [phone]   [facility] Recovery [address], [place], [place] [phone]  Insurance/Medicaid/sponsorship through [facility] and Families [address]                                    [place], [place] [phone] Therapy/tele-psych/case  [facility] [address]   [place], [place] [phone]    Dr. Lolly Mustache  [phone]   [facility]  [employer] [facility] Dept. 1) [address] [place] 2) [address], [place] 3)  [address], [place] [phone]  [phone]   [facility] Child Abuse Hotline 854-588-3635 or [phone] (After Hours)       Albuterol inhaler. Erythromycin.

## 2014-06-18 NOTE — ED Notes (Addendum)
Pt reports central upper chest pressure since last night.  Pt reports she has had a cold and sputum feels like "it gets trapped in my throat."  Pt denies SOB, diaphoresis, or dizziness with the cp.  Pt also reports eye drainage and blurred vision x 1 month.  Pt reports she has not seen anyone for it d/t no health insurance.  Pt also reports generalized itchy rash

## 2016-12-10 ENCOUNTER — Emergency Department (HOSPITAL_COMMUNITY)
Admission: EM | Admit: 2016-12-10 | Discharge: 2016-12-10 | Discharge status: Against Medical Advice | Payer: Medicaid Other | Attending: Emergency Medicine | Admitting: Emergency Medicine

## 2016-12-10 DIAGNOSIS — K0889 Other specified disorders of teeth and supporting structures: Secondary | ICD-10-CM | POA: Diagnosis present

## 2016-12-10 DIAGNOSIS — Z5321 Procedure and treatment not carried out due to patient leaving prior to being seen by health care provider: Secondary | ICD-10-CM | POA: Insufficient documentation

## 2016-12-10 NOTE — ED Notes (Signed)
Pt has cavities on the lower right molars, and started to have shooting pain up in her face onset yesterday.

## 2016-12-10 NOTE — ED Notes (Signed)
Pt. Called to triage x 3 and no answer.RN notified.

## 2017-09-22 ENCOUNTER — Encounter (HOSPITAL_COMMUNITY): Payer: Self-pay | Admitting: Emergency Medicine

## 2017-09-22 ENCOUNTER — Other Ambulatory Visit: Payer: Self-pay

## 2017-09-22 ENCOUNTER — Emergency Department (HOSPITAL_COMMUNITY)
Admission: EM | Admit: 2017-09-22 | Discharge: 2017-09-22 | Disposition: A | Discharge status: Home / Self Care | Payer: Self-pay | Attending: Emergency Medicine | Admitting: Emergency Medicine

## 2017-09-22 DIAGNOSIS — F17210 Nicotine dependence, cigarettes, uncomplicated: Secondary | ICD-10-CM | POA: Insufficient documentation

## 2017-09-22 DIAGNOSIS — K047 Periapical abscess without sinus: Secondary | ICD-10-CM | POA: Insufficient documentation

## 2017-09-22 MED ORDER — CLINDAMYCIN HCL 150 MG PO CAPS: 150.0000 mg | ORAL_CAPSULE | Freq: Four times a day (QID) | ORAL | 0 refills | Status: DC

## 2017-09-22 MED ORDER — IBUPROFEN 800 MG PO TABS
800.0000 mg | ORAL_TABLET | Freq: Once | ORAL | Status: AC
  Administered 2017-09-22: 800 mg via ORAL
  Filled 2017-09-22: qty 1

## 2017-09-22 NOTE — ED Provider Notes (Signed)
[facility] Note   CSN: [id number] Arrival date & time: 09/22/17  [id number]     History   Chief Complaint Chief Complaint  Patient presents with  . Dental Pain    HPI Sierra Carson is a 37 y.o. female.  Pt comes in with c/o swelling to the right side of her face that occurred two days ago. Denies problems swallowing or breathing. She states that she knows that she knows that she has some teeth that need to be fixed. Denies fever.     History reviewed. No pertinent past medical history.  There are no active problems to display for this patient.   Past Surgical History:  Procedure Laterality Date  . CHOLECYSTECTOMY       OB History   None      Home Medications    Prior to Admission medications   Medication Sig Start Date End Date Taking? Authorizing Provider  cyclobenzaprine (FLEXERIL) 10 MG tablet Take 1 tablet (10 mg total) by mouth 2 (two) times daily as needed for muscle spasms. Patient not taking: Reported on 06/18/2014 07/16/13   Roxy HorsemanBrowning, Robert, PA-C  HYDROcodone-acetaminophen (NORCO/VICODIN) 5-325 MG per tablet Take 1-2 tablets by mouth every 6 (six) hours as needed. Patient not taking: Reported on 06/18/2014 07/16/13   Roxy HorsemanBrowning, Robert, PA-C  ibuprofen (ADVIL,MOTRIN) 800 MG tablet Take 1 tablet (800 mg total) by mouth 3 (three) times daily. Patient not taking: Reported on 06/18/2014 07/16/13   Roxy HorsemanBrowning, Robert, PA-C    Family History No family history on file.  Social History Social History   Tobacco Use  . Smoking status: Current Every Day Smoker    Packs/day: 0.25    Types: Cigarettes  . Smokeless tobacco: Current User  Substance Use Topics  . Alcohol use: No  . Drug use: No     Allergies   Patient has no known allergies.   Review of Systems Review of Systems  All other systems reviewed and are negative.    Physical Exam Updated Vital Signs BP (!) 130/98 (BP Location: Right Arm)   Pulse 62    Temp 98.1 F (36.7 C) (Oral)   Resp 18   Ht 5\' 7"  (1.702 m)   Wt 99.8 kg (220 lb)   LMP 09/08/2017   SpO2 99%   BMI 34.46 kg/m   Physical Exam  Constitutional: She appears well-developed and well-nourished.  HENT:  Swelling noted to the right lower jaw. Pt has multiple decayed and cracked teeth to the area  Cardiovascular: Normal rate.  Pulmonary/Chest: Effort normal.  Musculoskeletal: Normal range of motion.  Neurological: She is alert.  Skin: Skin is warm.  Vitals reviewed.    ED Treatments / Results  Labs (all labs ordered are listed, but only abnormal results are displayed) Labs Reviewed - No data to display  EKG None  Radiology No results found.  Procedures Procedures (including critical care time)  Medications Ordered in ED Medications - No data to display   Initial Impression / Assessment and Plan / ED Course  I have reviewed the triage vital signs and the nursing notes.  Pertinent labs & imaging results that were available during my care of the patient were reviewed by me and considered in my medical decision making (see chart for details).     Will treat with antibiotic. No sing of ludwigs angina. Discussed use of benadryl and importance of follow up with the dentist  Final Clinical Impressions(s) / ED Diagnoses  Final diagnoses:  None    ED Discharge Orders    None       Teressa Lower, NP 09/22/17 1013    Doug Sou, MD 10/08/17 [phone]

## 2017-09-22 NOTE — ED Triage Notes (Signed)
Pt. Stated, I have 3 bad teeth on the rt. Bottom got worse on Thursday

## 2018-10-24 ENCOUNTER — Encounter (HOSPITAL_BASED_OUTPATIENT_CLINIC_OR_DEPARTMENT_OTHER): Payer: Self-pay | Admitting: *Deleted

## 2018-10-24 ENCOUNTER — Emergency Department (HOSPITAL_BASED_OUTPATIENT_CLINIC_OR_DEPARTMENT_OTHER)
Admission: EM | Admit: 2018-10-24 | Discharge: 2018-10-24 | Disposition: A | Discharge status: Home / Self Care | Payer: Self-pay | Attending: Emergency Medicine | Admitting: Emergency Medicine

## 2018-10-24 ENCOUNTER — Other Ambulatory Visit: Payer: Self-pay

## 2018-10-24 ENCOUNTER — Emergency Department (HOSPITAL_BASED_OUTPATIENT_CLINIC_OR_DEPARTMENT_OTHER): Payer: Self-pay

## 2018-10-24 DIAGNOSIS — Y999 Unspecified external cause status: Secondary | ICD-10-CM | POA: Insufficient documentation

## 2018-10-24 DIAGNOSIS — S91332A Puncture wound without foreign body, left foot, initial encounter: Secondary | ICD-10-CM | POA: Insufficient documentation

## 2018-10-24 DIAGNOSIS — F17210 Nicotine dependence, cigarettes, uncomplicated: Secondary | ICD-10-CM | POA: Insufficient documentation

## 2018-10-24 DIAGNOSIS — W268XXA Contact with other sharp object(s), not elsewhere classified, initial encounter: Secondary | ICD-10-CM | POA: Insufficient documentation

## 2018-10-24 DIAGNOSIS — Y9301 Activity, walking, marching and hiking: Secondary | ICD-10-CM | POA: Insufficient documentation

## 2018-10-24 DIAGNOSIS — Y929 Unspecified place or not applicable: Secondary | ICD-10-CM | POA: Insufficient documentation

## 2018-10-24 DIAGNOSIS — Z23 Encounter for immunization: Secondary | ICD-10-CM | POA: Insufficient documentation

## 2018-10-24 LAB — CBC WITH DIFFERENTIAL/PLATELET
Abs Immature Granulocytes: 0.03 10*3/uL (ref 0.00–0.07)
Basophils Absolute: 0 10*3/uL (ref 0.0–0.1)
Basophils Relative: 0 %
Eosinophils Absolute: 0.1 10*3/uL (ref 0.0–0.5)
Eosinophils Relative: 1 %
HCT: 41.4 % (ref 36.0–46.0)
Hemoglobin: 14.1 g/dL (ref 12.0–15.0)
Immature Granulocytes: 0 %
Lymphocytes Relative: 21 %
Lymphs Abs: 1.9 10*3/uL (ref 0.7–4.0)
MCH: 27.7 pg (ref 26.0–34.0)
MCHC: 34.1 g/dL (ref 30.0–36.0)
MCV: 81.3 fL (ref 80.0–100.0)
Monocytes Absolute: 0.9 10*3/uL (ref 0.1–1.0)
Monocytes Relative: 10 %
Neutro Abs: 6.2 10*3/uL (ref 1.7–7.7)
Neutrophils Relative %: 68 %
Platelets: 259 10*3/uL (ref 150–400)
RBC: 5.09 MIL/uL (ref 3.87–5.11)
RDW: 12 % (ref 11.5–15.5)
WBC: 9.1 10*3/uL (ref 4.0–10.5)
nRBC: 0 % (ref 0.0–0.2)

## 2018-10-24 LAB — BASIC METABOLIC PANEL
Anion gap: 9 (ref 5–15)
BUN: 8 mg/dL (ref 6–20)
CO2: 23 mmol/L (ref 22–32)
Calcium: 8.9 mg/dL (ref 8.9–10.3)
Chloride: 107 mmol/L (ref 98–111)
Creatinine, Ser: 0.78 mg/dL (ref 0.44–1.00)
GFR calc Af Amer: >60 mL/min (ref >60)
GFR calc non Af Amer: >60 mL/min (ref >60)
Glucose, Bld: 91 mg/dL (ref 70–99)
Potassium: 3.4 mmol/L — ABNORMAL LOW (ref 3.5–5.1)
Sodium: 139 mmol/L (ref 135–145)

## 2018-10-24 LAB — C-REACTIVE PROTEIN: CRP: 2 mg/dL — ABNORMAL HIGH (ref <1.0)

## 2018-10-24 MED ORDER — TETANUS-DIPHTH-ACELL PERTUSSIS 5-2.5-18.5 LF-MCG/0.5 IM SUSP
0.5000 mL | Freq: Once | INTRAMUSCULAR | Status: AC
  Administered 2018-10-24: 0.5 mL via INTRAMUSCULAR
  Filled 2018-10-24: qty 0.5

## 2018-10-24 MED ORDER — KETOROLAC TROMETHAMINE 30 MG/ML IJ SOLN
30.0000 mg | Freq: Once | INTRAMUSCULAR | Status: AC
  Administered 2018-10-24: 30 mg via INTRAVENOUS
  Filled 2018-10-24: qty 1

## 2018-10-24 MED ORDER — CLINDAMYCIN HCL 150 MG PO CAPS
300.0000 mg | ORAL_CAPSULE | Freq: Once | ORAL | Status: AC
  Administered 2018-10-24: 300 mg via ORAL
  Filled 2018-10-24: qty 2

## 2018-10-24 MED ORDER — HYDROCODONE-ACETAMINOPHEN 5-325 MG PO TABS: 1.0000 | ORAL_TABLET | ORAL | 0 refills | Status: AC | PRN

## 2018-10-24 MED ORDER — CLINDAMYCIN HCL 300 MG PO CAPS: 300.0000 mg | ORAL_CAPSULE | Freq: Four times a day (QID) | ORAL | 0 refills | Status: AC

## 2018-10-24 MED ORDER — ACETAMINOPHEN 500 MG PO TABS
1000.0000 mg | ORAL_TABLET | Freq: Once | ORAL | Status: AC
  Administered 2018-10-24: 1000 mg via ORAL
  Filled 2018-10-24: qty 2

## 2018-10-24 MED ORDER — IBUPROFEN 600 MG PO TABS: 600.0000 mg | ORAL_TABLET | Freq: Four times a day (QID) | ORAL | 0 refills | Status: AC | PRN

## 2018-10-24 NOTE — ED Triage Notes (Signed)
Puncture wound to the bottom of her left foot 5 days ago. Nail went through her shoe into her skin. Swelling, redness and pain.

## 2018-10-24 NOTE — ED Provider Notes (Signed)
[facility] EMERGENCY DEPARTMENT Provider Note   CSN: [id number] Arrival date & time: 10/24/18  1532    History   Chief Complaint Chief Complaint  Patient presents with  . Puncture Wound    HPI Sierra NorseLillian Kilts is a 38 y.o. female.     HPI Patient states that on 7/4 she stepped on a wooden board.  A board and came up and struck her in the back of the left foot.  There was a nail embedded in the board.  She sustained a puncture wound just above the heel of her left foot.  Since that time she is had swelling and pain that extends up to her lower calf.  She denies any fever or chills.  Unknown last tetanus. History reviewed. No pertinent past medical history.  There are no active problems to display for this patient.   Past Surgical History:  Procedure Laterality Date  . CHOLECYSTECTOMY    . TUBAL LIGATION       OB History   No obstetric history on file.      Home Medications    Prior to Admission medications   Medication Sig Start Date End Date Taking? Authorizing Provider  clindamycin (CLEOCIN) 300 MG capsule Take 1 capsule (300 mg total) by mouth 4 (four) times daily. X 7 days 10/24/18   Loren RacerYelverton, Taegan Haider, MD  cyclobenzaprine (FLEXERIL) 10 MG tablet Take 1 tablet (10 mg total) by mouth 2 (two) times daily as needed for muscle spasms. Patient not taking: Reported on 06/18/2014 07/16/13   Roxy HorsemanBrowning, Robert, PA-C  HYDROcodone-acetaminophen [facility](NORCO) 5-325 MG tablet Take 1 tablet by mouth every 4 (four) hours as needed for severe pain. 10/24/18   Loren RacerYelverton, Lathan Gieselman, MD  ibuprofen (ADVIL) 600 MG tablet Take 1 tablet (600 mg total) by mouth every 6 (six) hours as needed. 10/24/18   Loren RacerYelverton, Remie Mathison, MD    Family History No family history on file.  Social History Social History   Tobacco Use  . Smoking status: Current Every Day Smoker    Packs/day: 0.25    Types: Cigarettes  . Smokeless tobacco: Current User  Substance Use Topics  . Alcohol use: No  . Drug use: No      Allergies   Patient has no known allergies.   Review of Systems Review of Systems  Constitutional: Negative for chills and fever.  Musculoskeletal: Positive for arthralgias and joint swelling.  Skin: Positive for wound.  Neurological: Negative for weakness and numbness.  All other systems reviewed and are negative.    Physical Exam Updated Vital Signs BP 140/84 (BP Location: Right Arm)   Pulse 84   Temp 98.5 F (36.9 C) (Oral)   Resp 16   Ht 5\' 7"  (1.702 m)   Wt 83.9 kg   LMP 10/03/2018   SpO2 100%   BMI 28.98 kg/m   Physical Exam Vitals signs and nursing note reviewed.  Constitutional:      Appearance: Normal appearance. She is well-developed.  HENT:     Head: Normocephalic and atraumatic.  Eyes:     Pupils: Pupils are equal, round, and reactive to light.  Neck:     Musculoskeletal: Normal range of motion and neck supple.  Cardiovascular:     Rate and Rhythm: Normal rate.  Pulmonary:     Effort: Pulmonary effort is normal.  Abdominal:     Palpations: Abdomen is soft.  Musculoskeletal: Normal range of motion.        General: Swelling and tenderness present.  Comments: Patient with puncture wound just superior to the lateral surface of the left calcaneus.  There is swelling that extends to the midfoot and up to the ankle.  Patient has mild tenderness to palpation surrounding the puncture wound to the Achilles tendon.  There is no crepitance, erythema or warmth.  Skin:    General: Skin is warm and dry.     Findings: No erythema or rash.  Neurological:     General: No focal deficit present.     Mental Status: She is alert and oriented to person, place, and time.  Psychiatric:        Behavior: Behavior normal.      ED Treatments / Results  Labs (all labs ordered are listed, but only abnormal results are displayed) Labs Reviewed  BASIC METABOLIC PANEL - Abnormal; Notable for the following components:      Result Value   Potassium 3.4 (*)    All  other components within normal limits  CBC WITH DIFFERENTIAL/PLATELET  C-REACTIVE PROTEIN    EKG None  Radiology Dg Foot Complete Left  Result Date: 10/24/2018 CLINICAL DATA:  Penetrating injury from piece of wood EXAM: LEFT FOOT - COMPLETE 3+ VIEW COMPARISON:  None. FINDINGS: Frontal, oblique, and lateral views were obtained. No evident fracture or dislocation. Joint spaces appear normal. No erosive change. There is an inferior calcaneal spur. No soft tissue air or radiopaque foreign body evident. IMPRESSION: No radiopaque foreign body or soft tissue air. No fracture or dislocation. No appreciable arthropathy. There is an inferior calcaneal spur. Electronically Signed   By: Lowella Grip III M.D.   On: 10/24/2018 16:08    Procedures Procedures (including critical care time)  Medications Ordered in ED Medications  Tdap (BOOSTRIX) injection 0.5 mL (0.5 mLs Intramuscular Given 10/24/18 1613)  clindamycin (CLEOCIN) capsule 300 mg (300 mg Oral Given 10/24/18 1612)  ketorolac (TORADOL) 30 MG/ML injection 30 mg (30 mg Intravenous Given 10/24/18 1723)  acetaminophen (TYLENOL) tablet 1,000 mg (1,000 mg Oral Given 10/24/18 1723)     Initial Impression / Assessment and Plan / ED Course  I have reviewed the triage vital signs and the nursing notes.  Pertinent labs & imaging results that were available during my care of the patient were reviewed by me and considered in my medical decision making (see chart for details).        Puncture wound to the left foot.  Concern for possible wound infection.  Will get x-ray rule out foreign body or evidence of gas.  Will update tetanus X-ray without acute abnormality.  Normal white blood cell count.  Patient states she is feeling much better after pain medication.  Given first dose of clindamycin in the emergency department.  Advised to have wound reevaluated in 2 days.  Strict return precautions have been given. Final Clinical Impressions(s) / ED Diagnoses    Final diagnoses:  Puncture wound of left foot, initial encounter    ED Discharge Orders         Ordered    clindamycin (CLEOCIN) 300 MG capsule  4 times daily     10/24/18 1831    HYDROcodone-acetaminophen (NORCO) 5-325 MG tablet  Every 4 hours PRN     10/24/18 1831    ibuprofen (ADVIL) 600 MG tablet  Every 6 hours PRN     10/24/18 1831           Julianne Rice, MD 10/24/18 2232

## 2018-10-24 NOTE — Discharge Instructions (Addendum)
Return in 2 days to reevaluate puncture wound.  Return immediately for any worsening pain, swelling, fever or for any concerns.

## 2019-08-07 IMAGING — CR LEFT FOOT - COMPLETE 3+ VIEW
3 series · 3 of 3 positions shown · non-contrast
Comparison: None.

CLINICAL DATA: Penetrating injury from piece of [place]

EXAM:
LEFT FOOT - COMPLETE 3+ VIEW

[t foot ap left]
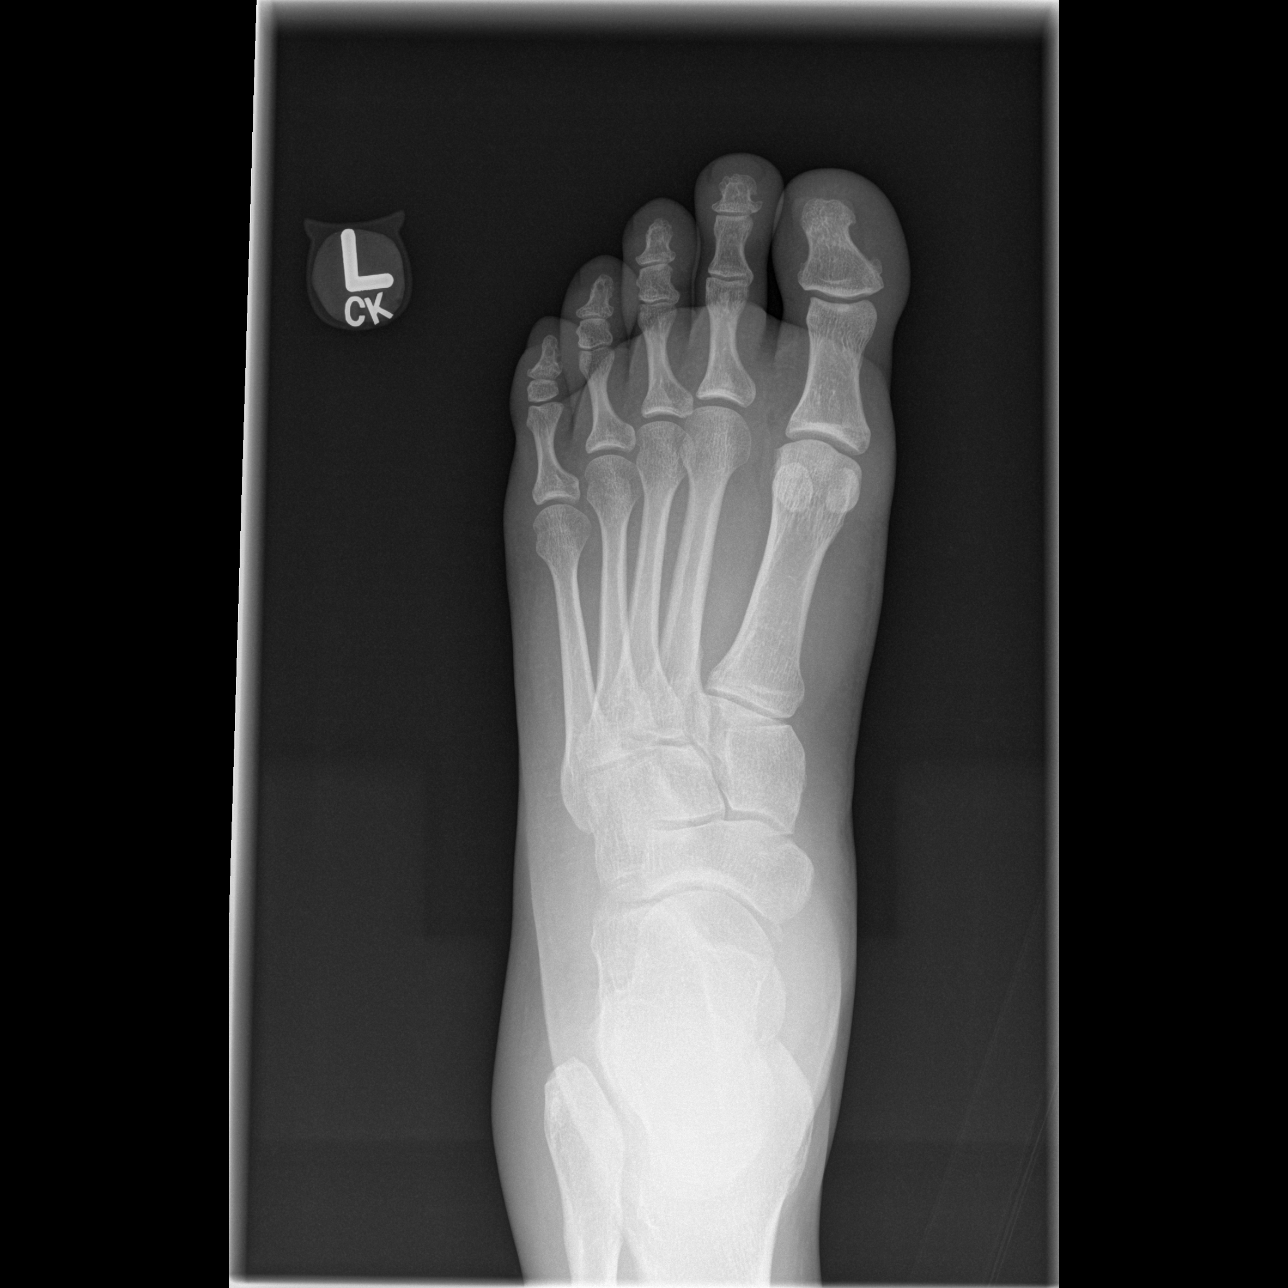

[t foot oblique left]
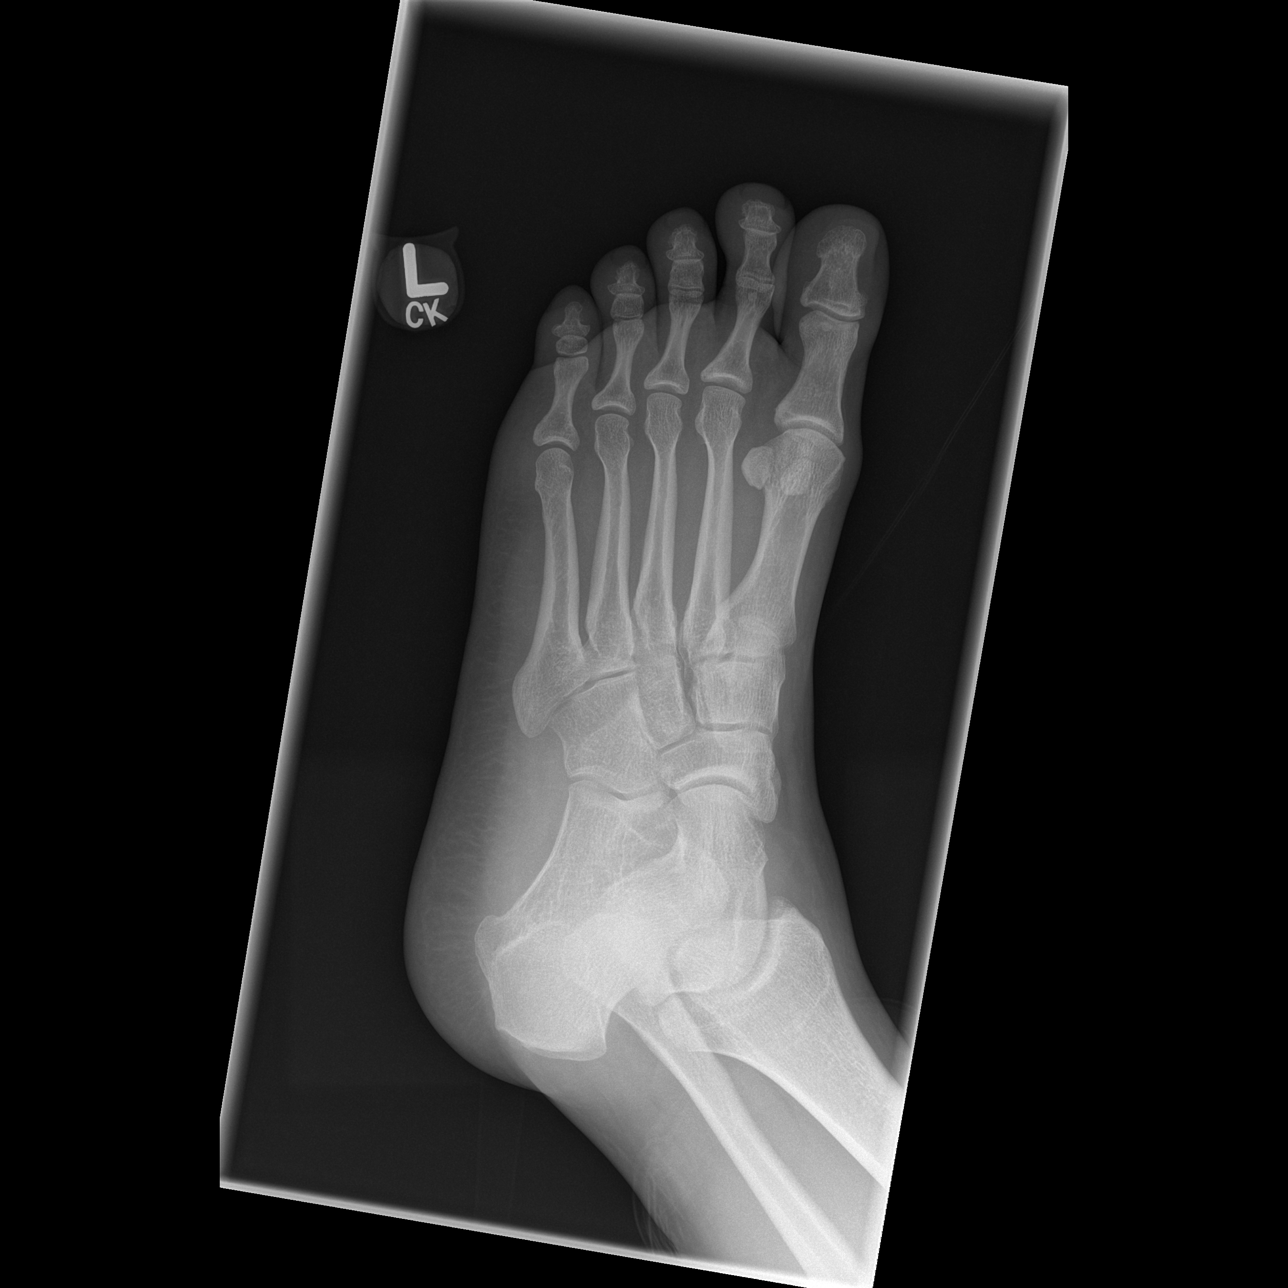

[t foot lat left]
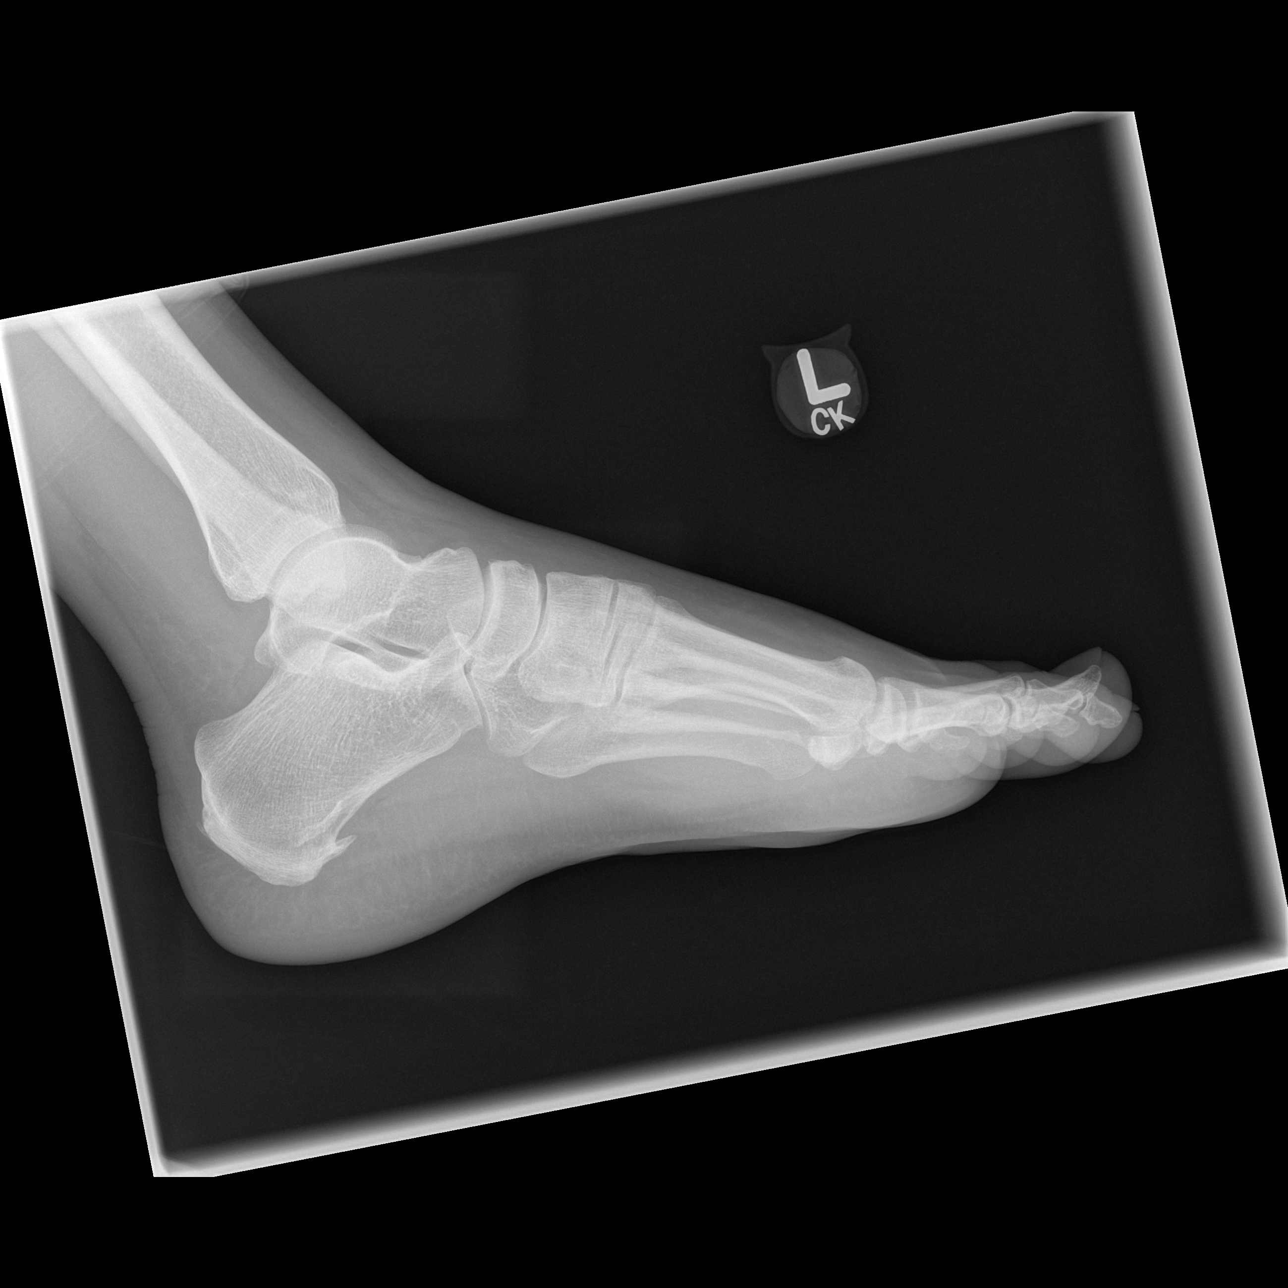

[3 of 3 positions shown; findings below may reference images not displayed]

FINDINGS: Frontal, oblique, and lateral views were obtained. No evident
fracture or dislocation. Joint spaces appear normal. No erosive
change. There is an inferior calcaneal spur.

No soft tissue air or radiopaque foreign body evident.
IMPRESSION: No radiopaque foreign body or soft tissue air. No fracture or
dislocation. No appreciable arthropathy. There is an inferior
calcaneal spur.
